# Patient Record
Sex: Male | Born: 1985 | Race: White | Hispanic: No | Marital: Married | Smoking: Current every day smoker
Health system: Southern US, Community
[De-identification: ages and names within clinical notes are randomized; demographics above are authoritative.]

---

## 2017-08-24 ENCOUNTER — Other Ambulatory Visit: Payer: Self-pay | Admitting: *Deleted

## 2017-08-24 ENCOUNTER — Ambulatory Visit
Admission: RE | Admit: 2017-08-24 | Discharge: 2017-08-24 | Disposition: A | Discharge status: Home / Self Care | Payer: No Typology Code available for payment source | Source: Ambulatory Visit | Attending: *Deleted | Admitting: *Deleted

## 2017-08-24 DIAGNOSIS — Z9289 Personal history of other medical treatment: Secondary | ICD-10-CM

## 2019-10-17 ENCOUNTER — Encounter (HOSPITAL_BASED_OUTPATIENT_CLINIC_OR_DEPARTMENT_OTHER): Payer: Self-pay | Admitting: Emergency Medicine

## 2019-10-17 ENCOUNTER — Other Ambulatory Visit: Payer: Self-pay

## 2019-10-17 ENCOUNTER — Emergency Department (HOSPITAL_BASED_OUTPATIENT_CLINIC_OR_DEPARTMENT_OTHER)
Admission: EM | Admit: 2019-10-17 | Discharge: 2019-10-17 | Disposition: A | Discharge status: Home / Self Care | Payer: No Typology Code available for payment source | Attending: Emergency Medicine | Admitting: Emergency Medicine

## 2019-10-17 DIAGNOSIS — Z5321 Procedure and treatment not carried out due to patient leaving prior to being seen by health care provider: Secondary | ICD-10-CM | POA: Insufficient documentation

## 2019-10-17 DIAGNOSIS — H5712 Ocular pain, left eye: Secondary | ICD-10-CM | POA: Insufficient documentation

## 2019-10-17 DIAGNOSIS — H538 Other visual disturbances: Secondary | ICD-10-CM | POA: Insufficient documentation

## 2019-10-17 NOTE — ED Triage Notes (Signed)
Pt reports L eye pain without known onset today. States blurry vision

## 2019-10-17 NOTE — ED Notes (Signed)
Called pt to take to a room for treatment  No answer from lobby  ED registration clerk states pt left

## 2021-07-05 ENCOUNTER — Encounter (HOSPITAL_BASED_OUTPATIENT_CLINIC_OR_DEPARTMENT_OTHER): Payer: Self-pay | Admitting: Emergency Medicine

## 2021-07-05 ENCOUNTER — Emergency Department (HOSPITAL_BASED_OUTPATIENT_CLINIC_OR_DEPARTMENT_OTHER)
Admission: EM | Admit: 2021-07-05 | Discharge: 2021-07-05 | Disposition: A | Discharge status: Home / Self Care | Payer: BC Managed Care – PPO | Attending: Emergency Medicine | Admitting: Emergency Medicine

## 2021-07-05 ENCOUNTER — Other Ambulatory Visit: Payer: Self-pay

## 2021-07-05 ENCOUNTER — Emergency Department (HOSPITAL_BASED_OUTPATIENT_CLINIC_OR_DEPARTMENT_OTHER): Payer: BC Managed Care – PPO

## 2021-07-05 DIAGNOSIS — R197 Diarrhea, unspecified: Secondary | ICD-10-CM | POA: Insufficient documentation

## 2021-07-05 DIAGNOSIS — R112 Nausea with vomiting, unspecified: Secondary | ICD-10-CM | POA: Insufficient documentation

## 2021-07-05 DIAGNOSIS — R109 Unspecified abdominal pain: Secondary | ICD-10-CM | POA: Diagnosis not present

## 2021-07-05 LAB — CBC WITH DIFFERENTIAL/PLATELET
Abs Immature Granulocytes: 0.02 10*3/uL (ref 0.00–0.07)
Basophils Absolute: 0 10*3/uL (ref 0.0–0.1)
Basophils Relative: 0 %
Eosinophils Absolute: 0 10*3/uL (ref 0.0–0.5)
Eosinophils Relative: 0 %
HCT: 47.8 % (ref 39.0–52.0)
Hemoglobin: 16.4 g/dL (ref 13.0–17.0)
Immature Granulocytes: 0 %
Lymphocytes Relative: 13 %
Lymphs Abs: 1.4 10*3/uL (ref 0.7–4.0)
MCH: 29.8 pg (ref 26.0–34.0)
MCHC: 34.3 g/dL (ref 30.0–36.0)
MCV: 86.9 fL (ref 80.0–100.0)
Monocytes Absolute: 1.1 10*3/uL — ABNORMAL HIGH (ref 0.1–1.0)
Monocytes Relative: 10 %
Neutro Abs: 8.2 10*3/uL — ABNORMAL HIGH (ref 1.7–7.7)
Neutrophils Relative %: 77 %
Platelets: 305 10*3/uL (ref 150–400)
RBC: 5.5 MIL/uL (ref 4.22–5.81)
RDW: 11.9 % (ref 11.5–15.5)
WBC: 10.8 10*3/uL — ABNORMAL HIGH (ref 4.0–10.5)
nRBC: 0 % (ref 0.0–0.2)

## 2021-07-05 LAB — COMPREHENSIVE METABOLIC PANEL
ALT: 24 U/L (ref 0–44)
AST: 23 U/L (ref 15–41)
Albumin: 4.1 g/dL (ref 3.5–5.0)
Alkaline Phosphatase: 84 U/L (ref 38–126)
Anion gap: 6 (ref 5–15)
BUN: 10 mg/dL (ref 6–20)
CO2: 30 mmol/L (ref 22–32)
Calcium: 9.3 mg/dL (ref 8.9–10.3)
Chloride: 103 mmol/L (ref 98–111)
Creatinine, Ser: 0.98 mg/dL (ref 0.61–1.24)
GFR, Estimated: >60 mL/min (ref >60)
Glucose, Bld: 127 mg/dL — ABNORMAL HIGH (ref 70–99)
Potassium: 3.7 mmol/L (ref 3.5–5.1)
Sodium: 139 mmol/L (ref 135–145)
Total Bilirubin: 0.5 mg/dL (ref 0.3–1.2)
Total Protein: 7.5 g/dL (ref 6.5–8.1)

## 2021-07-05 LAB — LIPASE, BLOOD: Lipase: 33 U/L (ref 11–51)

## 2021-07-05 MED ORDER — ONDANSETRON HCL 4 MG/2ML IJ SOLN
4.0000 mg | Freq: Once | INTRAMUSCULAR | Status: AC
  Administered 2021-07-05: 4 mg via INTRAVENOUS
  Filled 2021-07-05: qty 2

## 2021-07-05 MED ORDER — HYDROMORPHONE HCL 1 MG/ML IJ SOLN
1.0000 mg | Freq: Once | INTRAMUSCULAR | Status: AC
  Administered 2021-07-05: 1 mg via INTRAVENOUS
  Filled 2021-07-05: qty 1

## 2021-07-05 MED ORDER — ONDANSETRON 4 MG PO TBDP: ORAL_TABLET | ORAL | 0 refills | Status: AC

## 2021-07-05 MED ORDER — IOHEXOL 300 MG/ML  SOLN
100.0000 mL | Freq: Once | INTRAMUSCULAR | Status: AC | PRN
  Administered 2021-07-05: 100 mL via INTRAVENOUS

## 2021-07-05 MED ORDER — SODIUM CHLORIDE 0.9 % IV BOLUS
1000.0000 mL | Freq: Once | INTRAVENOUS | Status: AC
  Administered 2021-07-05: 1000 mL via INTRAVENOUS

## 2021-07-05 MED ORDER — DIPHENOXYLATE-ATROPINE 2.5-0.025 MG PO TABS
1.0000 | ORAL_TABLET | Freq: Four times a day (QID) | ORAL | 0 refills | Status: AC | PRN
Start: 2021-07-05 — End: ?

## 2021-07-05 MED ORDER — METOCLOPRAMIDE HCL 5 MG/ML IJ SOLN
10.0000 mg | Freq: Once | INTRAMUSCULAR | Status: AC
  Administered 2021-07-05: 10 mg via INTRAVENOUS
  Filled 2021-07-05: qty 2

## 2021-07-05 NOTE — ED Notes (Signed)
Patient transported to CT 

## 2021-07-05 NOTE — ED Triage Notes (Signed)
Pt reports a sudden onset of epigastric pain around 2300. Pt states it feels like "twisting" and rates his pain "12"/10. Pt also reports n/v/d. Denies fever. Pt is diaphoretic.  ?

## 2021-07-05 NOTE — ED Provider Notes (Signed)
?[facility] EMERGENCY DEPARTMENT ?Provider Note ? ? ?CSN: [id number] ?Arrival date & time: 07/05/21  0457 ? ?  ? ?History ? ?Chief Complaint  ?Patient presents with  ? Abdominal Pain  ? ? ?Cory Cameron is a 36 y.o. male. ? ?Presents to the emergency department for evaluation of abdominal pain with nausea, vomiting and diarrhea.  Patient reports that symptoms began around 11 PM last night.  He has had multiple episodes of emesis and diarrhea since then.  Patient complaining of a sharp, stabbing, twisting pain in his upper abdomen. ? ? ?  ? ?Home Medications ?Prior to Admission medications   ?Medication Sig Start Date End Date Taking? Authorizing Provider  ?diphenoxylate-atropine (LOMOTIL) 2.5-0.025 MG tablet Take 1-2 tablets by mouth 4 (four) times daily as needed for diarrhea or loose stools. 07/05/21  Yes Elena Davia, Canary Brim, MD  ?ondansetron (ZOFRAN-ODT) 4 MG disintegrating tablet 4mg  ODT q4 hours prn nausea/vomit 07/05/21  Yes Glenette Bookwalter, 09/04/21, MD  ?   ? ?Allergies    ?Patient has no known allergies.   ? ?Review of Systems   ?Review of Systems ? ?Physical Exam ?Updated Vital Signs ?BP (!) 136/94 (BP Location: Left Arm)   Pulse 71   Temp 97.8 ?F (36.6 ?C) (Oral)   Resp 14   Ht 6\' 1"  (1.854 m)   Wt 89.4 kg   SpO2 93%   BMI 25.99 kg/m?  ?Physical Exam ?Vitals and nursing note reviewed.  ?Constitutional:   ?   General: He is in acute distress.  ?   Appearance: He is well-developed. He is diaphoretic.  ?HENT:  ?   Head: Normocephalic and atraumatic.  ?   Mouth/Throat:  ?   Mouth: Mucous membranes are moist.  ?Eyes:  ?   General: Vision grossly intact. Gaze aligned appropriately.  ?   Extraocular Movements: Extraocular movements intact.  ?   Conjunctiva/sclera: Conjunctivae normal.  ?Cardiovascular:  ?   Rate and Rhythm: Normal rate and regular rhythm.  ?   Pulses: Normal pulses.  ?   Heart sounds: Normal heart sounds, S1 normal and S2 normal. No murmur heard. ?  No friction rub. No gallop.   ?Pulmonary:  ?   Effort: Pulmonary effort is normal. No respiratory distress.  ?   Breath sounds: Normal breath sounds.  ?Abdominal:  ?   Palpations: Abdomen is soft.  ?   Tenderness: There is abdominal tenderness in the epigastric area. There is no guarding or rebound.  ?   Hernia: No hernia is present.  ?Musculoskeletal:     ?   General: No swelling.  ?   Cervical back: Full passive range of motion without pain, normal range of motion and neck supple. No pain with movement, spinous process tenderness or muscular tenderness. Normal range of motion.  ?   Right lower leg: No edema.  ?   Left lower leg: No edema.  ?Skin: ?   General: Skin is warm.  ?   Capillary Refill: Capillary refill takes less than 2 seconds.  ?   Findings: No ecchymosis, erythema, lesion or wound.  ?Neurological:  ?   Mental Status: He is alert and oriented to person, place, and time.  ?   GCS: GCS eye subscore is 4. GCS verbal subscore is 5. GCS motor subscore is 6.  ?   Cranial Nerves: Cranial nerves 2-12 are intact.  ?   Sensory: Sensation is intact.  ?   Motor: Motor function is intact. No weakness or  abnormal muscle tone.  ?   Coordination: Coordination is intact.  ?Psychiatric:     ?   Mood and Affect: Mood normal.     ?   Speech: Speech normal.     ?   Behavior: Behavior normal.  ? ? ?ED Results / Procedures / Treatments   ?Labs ?(all labs ordered are listed, but only abnormal results are displayed) ?Labs Reviewed  ?CBC WITH DIFFERENTIAL/PLATELET - Abnormal; Notable for the following components:  ?    Result Value  ? WBC 10.8 (*)   ? Neutro Abs 8.2 (*)   ? Monocytes Absolute 1.1 (*)   ? All other components within normal limits  ?COMPREHENSIVE METABOLIC PANEL - Abnormal; Notable for the following components:  ? Glucose, Bld 127 (*)   ? All other components within normal limits  ?LIPASE, BLOOD  ? ? ?EKG ?EKG Interpretation ? ?Date/Time:  Monday Jul 05 2021 05:30:39 EDT ?Ventricular Rate:  76 ?PR Interval:  167 ?QRS Duration: 81 ?QT  Interval:  373 ?QTC Calculation: 420 ?R Axis:   81 ?Text Interpretation: Sinus arrhythmia ST elev, probable normal early repol pattern No previous tracing Confirmed by Gilda Crease [phone]) on 07/05/2021 5:37:36 AM ? ?Radiology ?CT ABDOMEN PELVIS W CONTRAST ? ?Result Date: 07/05/2021 ?CLINICAL DATA:  Sudden onset of epigastric pain. Nausea vomiting and diarrhea. EXAM: CT ABDOMEN AND PELVIS WITH CONTRAST TECHNIQUE: Multidetector CT imaging of the abdomen and pelvis was performed using the standard protocol following bolus administration of intravenous contrast. RADIATION DOSE REDUCTION: This exam was performed according to the departmental dose-optimization program which includes automated exposure control, adjustment of the mA and/or kV according to patient size and/or use of iterative reconstruction technique. CONTRAST:  OMNIPAQUE IOHEXOL 300 MG/ML  SOLN COMPARISON:  None Available. FINDINGS: Lower chest: No acute abnormality. Hepatobiliary: No focal liver abnormality is seen. No gallstones, gallbladder wall thickening, or biliary dilatation. Pancreas: Unremarkable. No pancreatic ductal dilatation or surrounding inflammatory changes. Spleen: Normal in size without focal abnormality. Adrenals/Urinary Tract: Normal adrenal glands. Small bilateral renal hypodensities compatible with Bosniak class 1 cyst. No follow-up recommended. No signs of nephrolithiasis, hydronephrosis or mass. Urinary bladder appears normal. Stomach/Bowel: Stomach appears normal. The appendix is visualized and is within normal limits. There is mild diffuse wall thickening involving the mid and distal small bowel loops up to the level of the terminal ileum. The mid and distal small bowel loops are mildly increased in caliber measuring up to 2.5 cm. There is abrupt caliber change of the small bowel just proximal to the ileocecal valve where the distal terminal ileum has wall thickness measuring up to 7 mm. Liquid stool with air-fluid  levels identified throughout the colon. Focal luminal narrowing is identified at the junction between the sigmoid colon and rectum, image 77/2. Vascular/Lymphatic: No significant vascular findings are present. No enlarged abdominal or pelvic lymph nodes. Reproductive: Prostate is unremarkable. Other: No abdominal wall hernia or abnormality. Small volume of free fluid noted within the pelvis. No focal fluid collections identified to suggest abscess. No signs of pneumoperitoneum. Musculoskeletal: No acute or significant osseous findings. IMPRESSION: 1. There is mild diffuse wall thickening involving the mid and distal small bowel loops up to the level of the terminal ileum. There is abrupt decreased caliber of the small bowel just proximal to the ileocecal valve where the distal terminal ileum has wall thickness measuring up to 7 mm. Findings are favored to represent an inflammatory or infectious enteritis. 2. Liquid stool with air-fluid levels  identified throughout the colon which may reflect diarrheal state. 3. Focal luminal narrowing is identified at the junction between the sigmoid colon and rectum. This is nonspecific and likely to be related to peristalsis. Underlying colonic neoplasm not excluded. Consider further evaluation with nonemergent colonoscopy. 4. Small volume of free fluid noted within the pelvis. 5. The appendix is visualized and is within normal limits. Electronically Signed   By: Signa Kellaylor  Stroud M.D.   On: 07/05/2021 06:38   ? ?Procedures ?Procedures  ? ? ?Medications Ordered in ED ?Medications  ?sodium chloride 0.9 % bolus 1,000 mL (1,000 mLs Intravenous New Bag/Given 07/05/21 0531)  ?ondansetron [facility](ZOFRAN) injection 4 mg (4 mg Intravenous Given 07/05/21 0532)  ?HYDROmorphone (DILAUDID) injection 1 mg (1 mg Intravenous Given 07/05/21 0533)  ?metoCLOPramide (REGLAN) injection 10 mg (10 mg Intravenous Given 07/05/21 0536)  ?iohexol (OMNIPAQUE) 300 MG/ML solution 100 mL (100 mLs Intravenous Contrast Given  07/05/21 0611)  ? ? ?ED Course/ Medical Decision Making/ A&P ?  ?                        ?Medical Decision Making ?Amount and/or Complexity of Data Reviewed ?Labs: ordered. ?Radiology: ordered. ? ?Risk ?Prescription drug management.

## 2021-07-05 NOTE — ED Notes (Signed)
Pt returned from CT °

## 2021-08-29 ENCOUNTER — Other Ambulatory Visit: Payer: Self-pay

## 2021-08-29 ENCOUNTER — Emergency Department (HOSPITAL_BASED_OUTPATIENT_CLINIC_OR_DEPARTMENT_OTHER)
Admission: EM | Admit: 2021-08-29 | Discharge: 2021-08-29 | Disposition: A | Discharge status: Home / Self Care | Payer: BC Managed Care – PPO | Attending: Emergency Medicine | Admitting: Emergency Medicine

## 2021-08-29 ENCOUNTER — Emergency Department (HOSPITAL_BASED_OUTPATIENT_CLINIC_OR_DEPARTMENT_OTHER): Payer: BC Managed Care – PPO

## 2021-08-29 ENCOUNTER — Encounter (HOSPITAL_BASED_OUTPATIENT_CLINIC_OR_DEPARTMENT_OTHER): Payer: Self-pay | Admitting: Emergency Medicine

## 2021-08-29 DIAGNOSIS — K2900 Acute gastritis without bleeding: Secondary | ICD-10-CM | POA: Diagnosis not present

## 2021-08-29 DIAGNOSIS — R1011 Right upper quadrant pain: Secondary | ICD-10-CM | POA: Diagnosis present

## 2021-08-29 LAB — CBC WITH DIFFERENTIAL/PLATELET
Abs Immature Granulocytes: 0.05 10*3/uL (ref 0.00–0.07)
Basophils Absolute: 0 10*3/uL (ref 0.0–0.1)
Basophils Relative: 0 %
Eosinophils Absolute: 0.1 10*3/uL (ref 0.0–0.5)
Eosinophils Relative: 1 %
HCT: 49.7 % (ref 39.0–52.0)
Hemoglobin: 16.8 g/dL (ref 13.0–17.0)
Immature Granulocytes: 0 %
Lymphocytes Relative: 18 %
Lymphs Abs: 2 10*3/uL (ref 0.7–4.0)
MCH: 30.3 pg (ref 26.0–34.0)
MCHC: 33.8 g/dL (ref 30.0–36.0)
MCV: 89.5 fL (ref 80.0–100.0)
Monocytes Absolute: 0.6 10*3/uL (ref 0.1–1.0)
Monocytes Relative: 6 %
Neutro Abs: 8.4 10*3/uL — ABNORMAL HIGH (ref 1.7–7.7)
Neutrophils Relative %: 75 %
Platelets: 324 10*3/uL (ref 150–400)
RBC: 5.55 MIL/uL (ref 4.22–5.81)
RDW: 12.1 % (ref 11.5–15.5)
WBC: 11.2 10*3/uL — ABNORMAL HIGH (ref 4.0–10.5)
nRBC: 0 % (ref 0.0–0.2)

## 2021-08-29 LAB — COMPREHENSIVE METABOLIC PANEL
ALT: 23 U/L (ref 0–44)
AST: 17 U/L (ref 15–41)
Albumin: 4.2 g/dL (ref 3.5–5.0)
Alkaline Phosphatase: 78 U/L (ref 38–126)
Anion gap: 6 (ref 5–15)
BUN: 16 mg/dL (ref 6–20)
CO2: 26 mmol/L (ref 22–32)
Calcium: 9.2 mg/dL (ref 8.9–10.3)
Chloride: 108 mmol/L (ref 98–111)
Creatinine, Ser: 0.99 mg/dL (ref 0.61–1.24)
GFR, Estimated: >60 mL/min (ref >60)
Glucose, Bld: 127 mg/dL — ABNORMAL HIGH (ref 70–99)
Potassium: 4.5 mmol/L (ref 3.5–5.1)
Sodium: 140 mmol/L (ref 135–145)
Total Bilirubin: 0.7 mg/dL (ref 0.3–1.2)
Total Protein: 7.5 g/dL (ref 6.5–8.1)

## 2021-08-29 LAB — LIPASE, BLOOD: Lipase: 34 U/L (ref 11–51)

## 2021-08-29 MED ORDER — ONDANSETRON HCL 4 MG PO TABS: 4.0000 mg | ORAL_TABLET | Freq: Four times a day (QID) | ORAL | 0 refills | Status: AC

## 2021-08-29 MED ORDER — ONDANSETRON HCL 4 MG/2ML IJ SOLN
4.0000 mg | Freq: Once | INTRAMUSCULAR | Status: AC
  Administered 2021-08-29: 4 mg via INTRAVENOUS
  Filled 2021-08-29: qty 2

## 2021-08-29 MED ORDER — MORPHINE SULFATE (PF) 4 MG/ML IV SOLN
4.0000 mg | Freq: Once | INTRAVENOUS | Status: AC
  Administered 2021-08-29: 4 mg via INTRAVENOUS
  Filled 2021-08-29: qty 1

## 2021-08-29 MED ORDER — PANTOPRAZOLE SODIUM 20 MG PO TBEC: 20.0000 mg | DELAYED_RELEASE_TABLET | Freq: Every day | ORAL | 0 refills | Status: AC

## 2021-08-29 MED ORDER — SODIUM CHLORIDE 0.9 % IV BOLUS
1000.0000 mL | Freq: Once | INTRAVENOUS | Status: AC
  Administered 2021-08-29: 1000 mL via INTRAVENOUS

## 2021-08-29 MED ORDER — PANTOPRAZOLE SODIUM 40 MG IV SOLR
40.0000 mg | Freq: Once | INTRAVENOUS | Status: AC
  Administered 2021-08-29: 40 mg via INTRAVENOUS
  Filled 2021-08-29: qty 10

## 2021-08-29 MED ORDER — SUCRALFATE 1 GM/10ML PO SUSP: 1.0000 g | Freq: Three times a day (TID) | ORAL | 0 refills | Status: AC

## 2021-08-29 NOTE — ED Provider Notes (Signed)
[facility] EMERGENCY DEPARTMENT Provider Note   CSN: [id number] Arrival date & time: 08/29/21  0349     History  Chief Complaint  Patient presents with   Abdominal Pain    Cory Cameron is a 36 y.o. male.  Patient is a 36 year old male who presents with abdominal pain.  He said it started early this morning.  It is mostly across the top part of his abdomen.  It is pretty intense.  He has had 1 episode of vomiting.  He has some ongoing nausea.  No change in his stools.  No diarrhea or constipation.  No known fevers.  He had a similar episode and was seen in the emergency department about 2 months ago.  He had a CT scan at that time which showed some evidence of enteritis and it was felt to be viral.  He followed up with gastroenterology and had a colonoscopy which was unrevealing.  He says the symptoms are pretty similar to the last episode he had.  Of note, he does smoke marijuana daily.  He denies any EtOH use.  He denies any significant symptoms of GERD.       Home Medications Prior to Admission medications   Medication Sig Start Date End Date Taking? Authorizing Provider  ondansetron (ZOFRAN) 4 MG tablet Take 1 tablet (4 mg total) by mouth every 6 (six) hours. 08/29/21  Yes Rolan Bucco, MD  pantoprazole (PROTONIX) 20 MG tablet Take 1 tablet (20 mg total) by mouth daily. 08/29/21  Yes Rolan Bucco, MD  sucralfate (CARAFATE) 1 GM/10ML suspension Take 10 mLs (1 g total) by mouth 4 (four) times daily -  with meals and at bedtime. 08/29/21  Yes Rolan Bucco, MD  diphenoxylate-atropine (LOMOTIL) 2.5-0.025 MG tablet Take 1-2 tablets by mouth 4 (four) times daily as needed for diarrhea or loose stools. 07/05/21   Gilda Crease, MD  ondansetron (ZOFRAN-ODT) 4 MG disintegrating tablet 4mg  ODT q4 hours prn nausea/vomit 07/05/21   Pollina, 09/04/21, MD      Allergies    Patient has no known allergies.    Review of Systems   Review of Systems  Constitutional:  Negative  for chills, diaphoresis, fatigue and fever.  HENT:  Negative for congestion, rhinorrhea and sneezing.   Eyes: Negative.   Respiratory:  Negative for cough, chest tightness and shortness of breath.   Cardiovascular:  Negative for chest pain and leg swelling.  Gastrointestinal:  Positive for abdominal pain, nausea and vomiting. Negative for blood in stool and diarrhea.  Genitourinary:  Negative for difficulty urinating, flank pain, frequency and hematuria.  Musculoskeletal:  Negative for arthralgias and back pain.  Skin:  Negative for rash.  Neurological:  Negative for dizziness, speech difficulty, weakness, numbness and headaches.    Physical Exam Updated Vital Signs BP 121/89   Pulse 72   Temp 98 F (36.7 C)   Resp 17   Ht 6\' 1"  (1.854 m)   Wt 90.7 kg   SpO2 98%   BMI 26.39 kg/m  Physical Exam Constitutional:      Appearance: He is well-developed. He is ill-appearing.  HENT:     Head: Normocephalic and atraumatic.  Eyes:     Pupils: Pupils are equal, round, and reactive to light.  Cardiovascular:     Rate and Rhythm: Normal rate and regular rhythm.     Heart sounds: Normal heart sounds.  Pulmonary:     Effort: Pulmonary effort is normal. No respiratory distress.  Breath sounds: Normal breath sounds. No wheezing or rales.  Chest:     Chest wall: No tenderness.  Abdominal:     General: Bowel sounds are normal.     Palpations: Abdomen is soft.     Tenderness: There is abdominal tenderness in the right upper quadrant and epigastric area. There is no guarding or rebound.  Musculoskeletal:        General: Normal range of motion.     Cervical back: Normal range of motion and neck supple.  Lymphadenopathy:     Cervical: No cervical adenopathy.  Skin:    General: Skin is warm and dry.     Findings: No rash.  Neurological:     Mental Status: He is alert and oriented to person, place, and time.     ED Results / Procedures / Treatments   Labs (all labs ordered are  listed, but only abnormal results are displayed) Labs Reviewed  COMPREHENSIVE METABOLIC PANEL - Abnormal; Notable for the following components:      Result Value   Glucose, Bld 127 (*)    All other components within normal limits  CBC WITH DIFFERENTIAL/PLATELET - Abnormal; Notable for the following components:   WBC 11.2 (*)    Neutro Abs 8.4 (*)    All other components within normal limits  LIPASE, BLOOD    EKG EKG Interpretation  Date/Time:  Sunday August 29 2021 07:18:27 EDT Ventricular Rate:  64 PR Interval:  145 QRS Duration: 82 QT Interval:  400 QTC Calculation: 413 R Axis:   69 Text Interpretation: Sinus rhythm ST elevation suggests acute pericarditis since last tracing no significant change Confirmed by Rolan Bucco [phone]) on 08/29/2021 7:20:52 AM  Radiology US Abdomen Limited RUQ (LIVER/GB)  Result Date: 08/29/2021 CLINICAL DATA:  36 year old male history of right upper quadrant abdominal pain, nausea and vomiting for 1 day. EXAM: ULTRASOUND ABDOMEN LIMITED RIGHT UPPER QUADRANT COMPARISON:  No priors. FINDINGS: Gallbladder: No gallstones or wall thickening visualized. No sonographic Murphy sign noted by sonographer. Common bile duct: Diameter: 3 mm Liver: No focal lesion identified. Within normal limits in parenchymal echogenicity. Portal vein is patent on color Doppler imaging with normal direction of blood flow towards the liver. Other: None. IMPRESSION: 1. No acute findings. Specifically, no gallstones or findings to suggest an acute cholecystitis at this time. Electronically Signed   By: Trudie Reed M.D.   On: 08/29/2021 09:44    Procedures Procedures    Medications Ordered in ED Medications  sodium chloride 0.9 % bolus 1,000 mL ( Intravenous Stopped 08/29/21 0841)  ondansetron (ZOFRAN) injection 4 mg (4 mg Intravenous Given 08/29/21 0741)  morphine (PF) 4 MG/ML injection 4 mg (4 mg Intravenous Given 08/29/21 0741)  pantoprazole (PROTONIX) injection 40 mg (40 mg  Intravenous Given 08/29/21 0741)    ED Course/ Medical Decision Making/ A&P                           Medical Decision Making Amount and/or Complexity of Data Reviewed External Data Reviewed: notes. Labs: ordered. Decision-making details documented in ED Course. Radiology: ordered. Decision-making details documented in ED Course. ECG/medicine tests: ordered and independent interpretation performed. Decision-making details documented in ED Course.  Risk Prescription drug management. Decision regarding hospitalization.   Patient is a 36 year old male who presents with upper abdominal pain associate with some nausea and one episode of vomiting.  He was fairly uncomfortable on arrival but has improved after treatment in the  ED with IV fluids, Protonix and morphine/Zofran.  He had a similar episode 2 months ago where he had a CT scan that showed some suggestions of enteritis.  He has localized pain in the upper abdomen.  Labs reveal no evidence of pancreatitis.  His LFTs are normal without signs of hepatitis.  He had a gallbladder ultrasound which showed no evidence of gallstones or cholecystitis.  He currently is feeling much better and does not have any significant pain on abdominal exam.  I feel his symptoms are likely related to gastritis.  I did also discuss with him possible cannabinoid hyperemesis syndrome.  He does not have a significant amount of vomiting which would be more indicative of this syndrome although he did have those type of symptoms on his last visit.  He does have a gastroenterologist which she saw after the first visit.  I recommended follow-up visit with a gastroenterologist which he will do.  He may need an endoscopy if his symptoms have not improved.  We will start him on Protonix, Carafate and Zofran.  Return precautions were given.  Final Clinical Impression(s) / ED Diagnoses Final diagnoses:  Acute gastritis without hemorrhage, unspecified gastritis type    Rx / DC  Orders ED Discharge Orders          Ordered    pantoprazole (PROTONIX) 20 MG tablet  Daily        08/29/21 1009    sucralfate (CARAFATE) 1 GM/10ML suspension  3 times daily with meals & bedtime        08/29/21 1009    ondansetron (ZOFRAN) 4 MG tablet  Every 6 hours        08/29/21 1009              Rolan Bucco, MD 08/29/21 1015

## 2021-08-29 NOTE — ED Triage Notes (Signed)
Pt arrives pov, slow gait, c/o abdominal pain with n/v/d starting early am today. Denies fever, denies CP

## 2021-08-29 NOTE — ED Notes (Signed)
ED Provider at bedside. 

## 2021-08-29 NOTE — ED Notes (Signed)
Lat marijuana use was yesterday

## 2023-09-08 IMAGING — CT CT ABD-PELV W/ CM
2 of 4 series · 15 of 46 positions shown, 17 images · IV contrast (Omnipaque)
Comparison: None Available.

CLINICAL DATA: Sudden onset of epigastric pain. Nausea vomiting and
diarrhea.

EXAM:
CT ABDOMEN AND PELVIS WITH CONTRAST
TECHNIQUE: Multidetector CT imaging of the abdomen and pelvis was performed
using the standard protocol following bolus administration of
intravenous contrast.

[Series 2: axial st · axial · 0.85mm/px · z∈[-526,-86]mm · 12 of 102 slices shown, 14 images]
[im 9/102  soft-tissue]
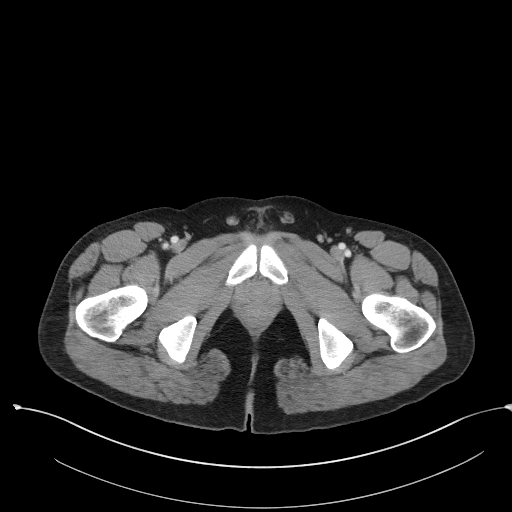
[im 9/102  bone]
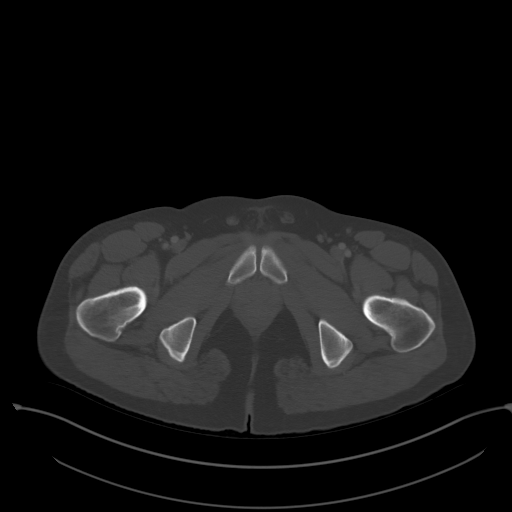
[im 17/102  soft-tissue]
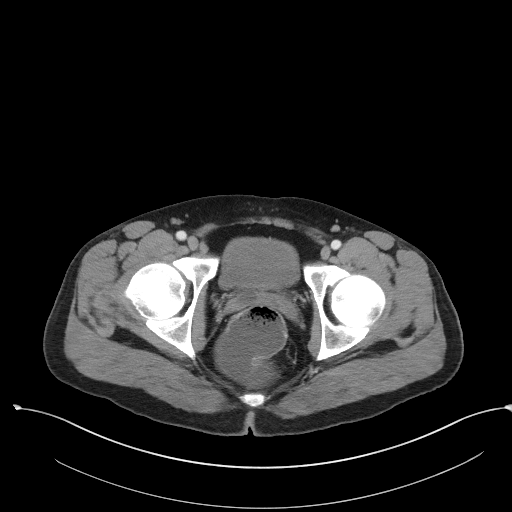
[im 25/102  soft-tissue]
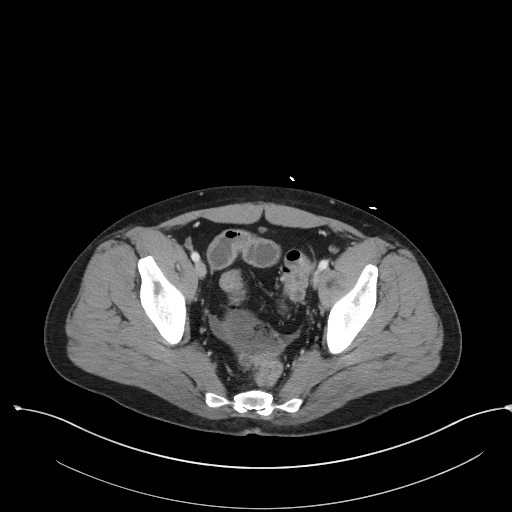
[im 33/102  soft-tissue]
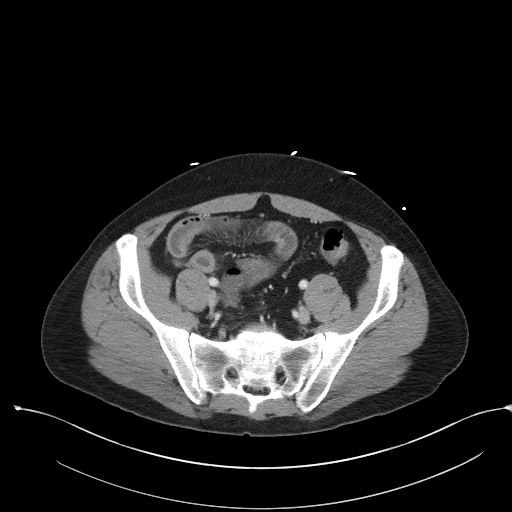
[im 41/102  soft-tissue]
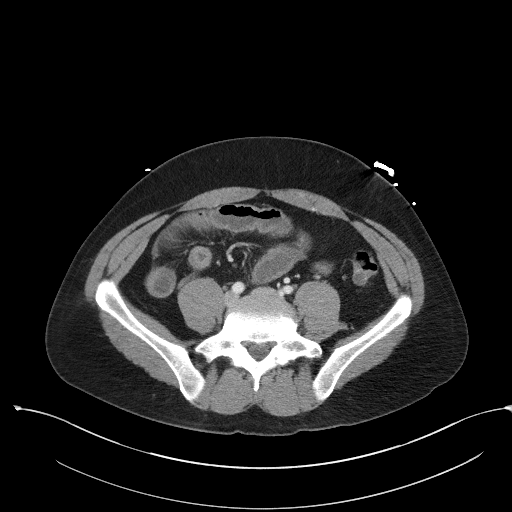
[im 49/102  soft-tissue]
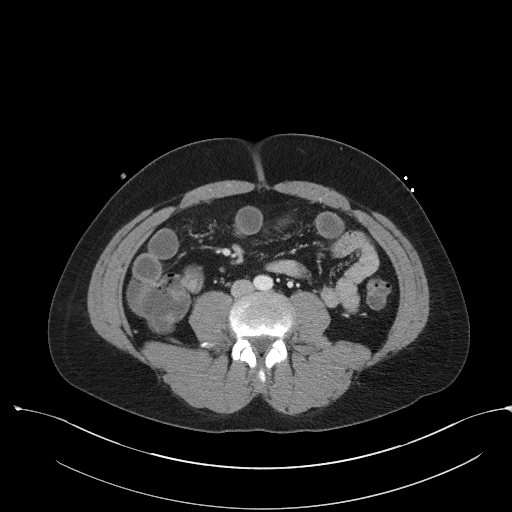
[im 57/102  soft-tissue]
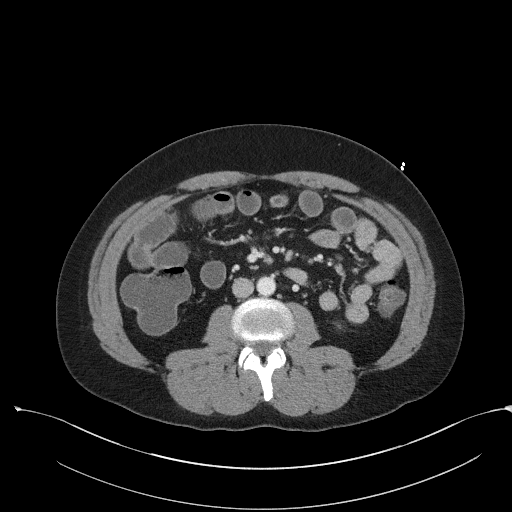
[im 65/102  soft-tissue]
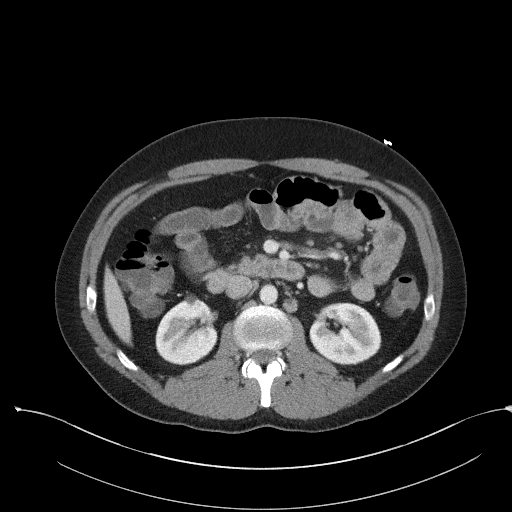
[im 73/102  soft-tissue]
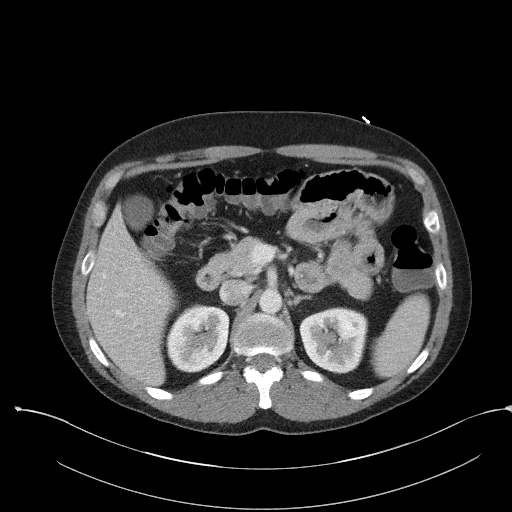
[im 73/102  bone]
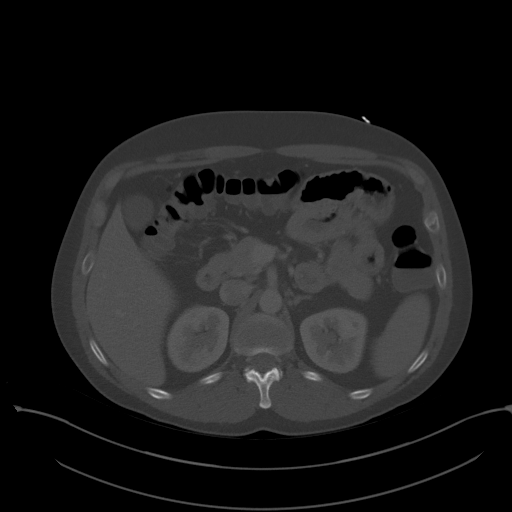
[im 81/102  soft-tissue]
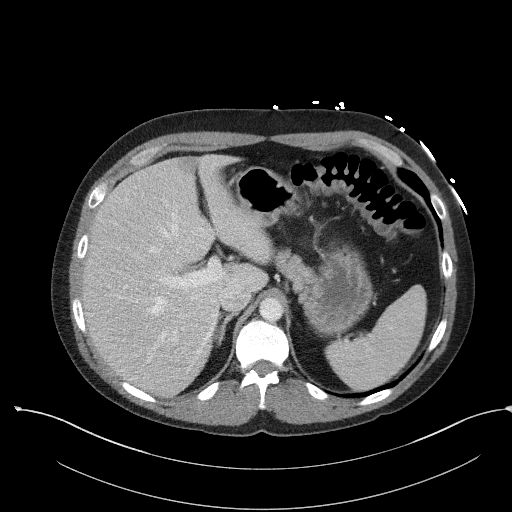
[im 89/102  soft-tissue]
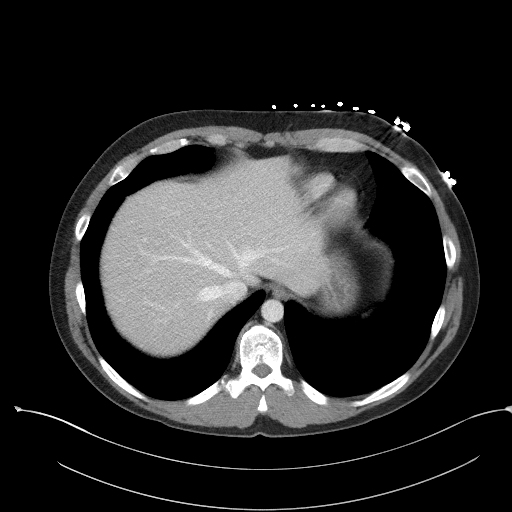
[im 97/102  soft-tissue]
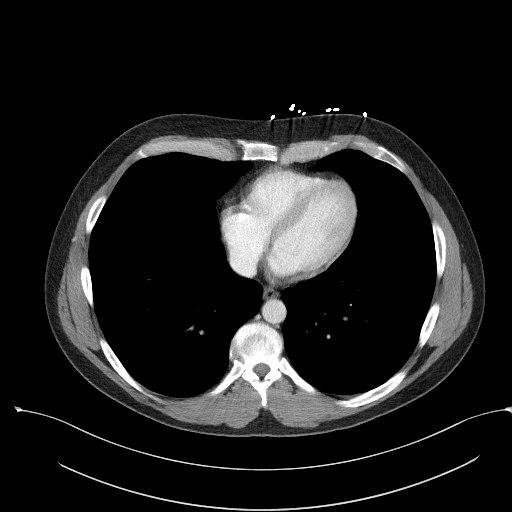

[Series 5: coronal st · coronal · 0.80mm/px · 3 of 101 slices shown]
[im 34/101  soft-tissue]
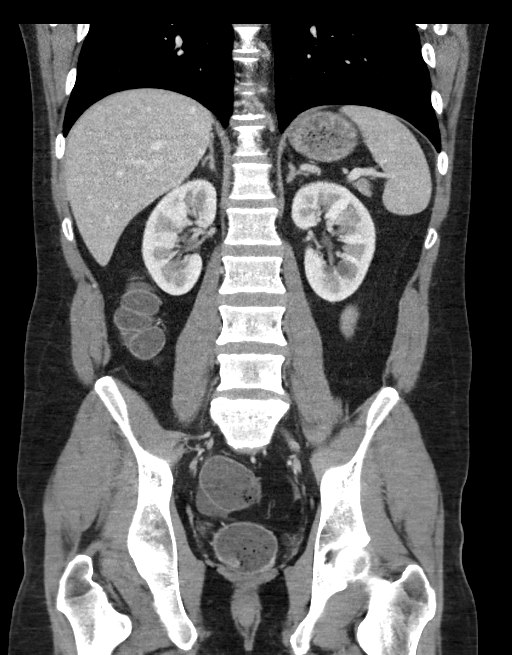
[im 45/101  soft-tissue]
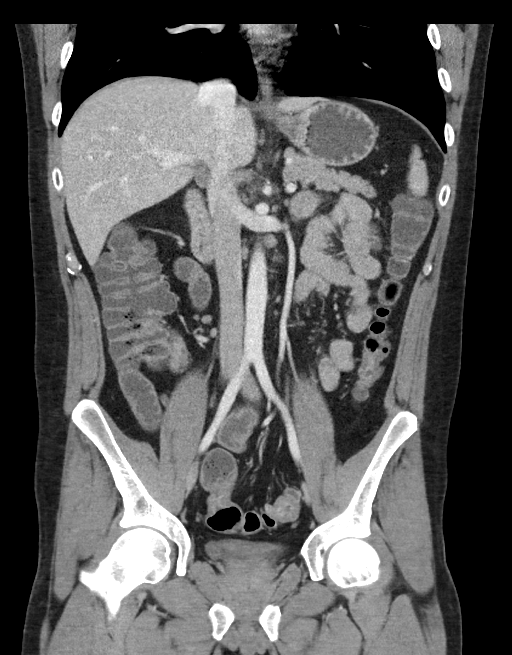
[im 56/101  soft-tissue]
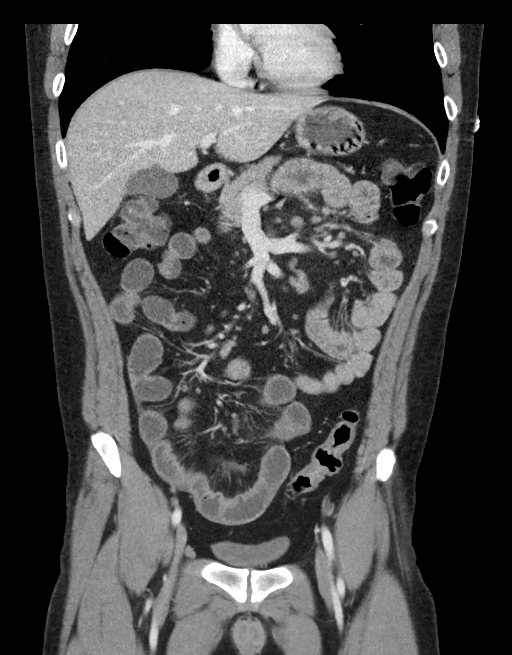

[15 of 46 positions shown; findings below may reference images not displayed]

RADIATION DOSE REDUCTION: This exam was performed according to the
departmental dose-optimization program which includes automated
exposure control, adjustment of the mA and/or kV according to
patient size and/or use of iterative reconstruction technique.

CONTRAST:  100mL OMNIPAQUE IOHEXOL 300 MG/ML  SOLN
FINDINGS: Lower chest: No acute abnormality.

Hepatobiliary: No focal liver abnormality is seen. No gallstones,
gallbladder wall thickening, or biliary dilatation.

Pancreas: Unremarkable. No pancreatic ductal dilatation or
surrounding inflammatory changes.

Spleen: Normal in size without focal abnormality.

Adrenals/Urinary Tract: Normal adrenal glands. Small bilateral renal
hypodensities compatible with Bosniak class 1 cyst. No follow-up
recommended. No signs of nephrolithiasis, hydronephrosis or mass.
Urinary bladder appears normal.

Stomach/Bowel: Stomach appears normal. The appendix is visualized
and is within normal limits. There is mild diffuse wall thickening
involving the mid and distal small bowel loops up to the level of
the terminal ileum. The mid and distal small bowel loops are mildly
increased in caliber measuring up to 2.5 cm. There is abrupt caliber
change of the small bowel just proximal to the ileocecal valve where
the distal terminal ileum has wall thickness measuring up to 7 mm.
Liquid stool with air-fluid levels identified throughout the colon.
Focal luminal narrowing is identified at the junction between the
sigmoid colon and rectum, image 77/2.

Vascular/Lymphatic: No significant vascular findings are present. No
enlarged abdominal or pelvic lymph nodes.

Reproductive: Prostate is unremarkable.

Other: No abdominal wall hernia or abnormality. Small volume of free
fluid noted within the pelvis. No focal fluid collections identified
to suggest abscess. No signs of pneumoperitoneum.

Musculoskeletal: No acute or significant osseous findings.
IMPRESSION: 1. There is mild diffuse wall thickening involving the mid and
distal small bowel loops up to the level of the terminal ileum.
There is abrupt decreased caliber of the small bowel just proximal
to the ileocecal valve where the distal terminal ileum has wall
thickness measuring up to 7 mm. Findings are favored to represent an
inflammatory or infectious enteritis.
2. Liquid stool with air-fluid levels identified throughout the
colon which may reflect diarrheal state.
3. Focal luminal narrowing is identified at the junction between the
sigmoid colon and rectum. This is nonspecific and likely to be
related to peristalsis. Underlying colonic neoplasm not excluded.
Consider further evaluation with nonemergent colonoscopy.
4. Small volume of free fluid noted within the pelvis.
5. The appendix is visualized and is within normal limits.
